# Patient Record
Sex: Male | Born: 2000 | Race: White | Hispanic: No | Marital: Single | Smoking: Never smoker
Health system: Southern US, Community
[De-identification: ages and names within clinical notes are randomized; demographics above are authoritative.]

---

## 2020-08-30 ENCOUNTER — Encounter (HOSPITAL_COMMUNITY): Payer: Self-pay | Admitting: *Deleted

## 2020-08-30 ENCOUNTER — Emergency Department (HOSPITAL_COMMUNITY)
Admission: EM | Admit: 2020-08-30 | Discharge: 2020-08-30 | Disposition: A | Discharge status: Home / Self Care | Payer: No Typology Code available for payment source | Attending: Emergency Medicine | Admitting: Emergency Medicine

## 2020-08-30 ENCOUNTER — Emergency Department (HOSPITAL_COMMUNITY): Payer: No Typology Code available for payment source

## 2020-08-30 ENCOUNTER — Other Ambulatory Visit: Payer: Self-pay

## 2020-08-30 DIAGNOSIS — Y990 Civilian activity done for income or pay: Secondary | ICD-10-CM | POA: Insufficient documentation

## 2020-08-30 DIAGNOSIS — W268XXA Contact with other sharp object(s), not elsewhere classified, initial encounter: Secondary | ICD-10-CM | POA: Insufficient documentation

## 2020-08-30 DIAGNOSIS — S51012A Laceration without foreign body of left elbow, initial encounter: Secondary | ICD-10-CM | POA: Insufficient documentation

## 2020-08-30 DIAGNOSIS — Z23 Encounter for immunization: Secondary | ICD-10-CM | POA: Diagnosis not present

## 2020-08-30 DIAGNOSIS — S59902A Unspecified injury of left elbow, initial encounter: Secondary | ICD-10-CM | POA: Diagnosis present

## 2020-08-30 MED ORDER — LIDOCAINE-EPINEPHRINE (PF) 2 %-1:200000 IJ SOLN
20.0000 mL | Freq: Once | INTRAMUSCULAR | Status: AC
  Administered 2020-08-30: 20 mL
  Filled 2020-08-30: qty 20

## 2020-08-30 MED ORDER — TETANUS-DIPHTH-ACELL PERTUSSIS 5-2.5-18.5 LF-MCG/0.5 IM SUSY
0.5000 mL | PREFILLED_SYRINGE | Freq: Once | INTRAMUSCULAR | Status: AC
  Administered 2020-08-30: 0.5 mL via INTRAMUSCULAR
  Filled 2020-08-30: qty 0.5

## 2020-08-30 NOTE — ED Provider Notes (Signed)
[facility] EMERGENCY DEPARTMENT Provider Note   CSN: [id number] Arrival date & time: 08/30/20  1511     History Chief Complaint  Patient presents with  . Laceration    Dustin Rowe is a 19 y.o. male.  The history is provided by the patient. No language interpreter was used.  Laceration Associated symptoms: no fever      19 year old male presenting for evaluation of left elbow injury.  Patient report a few hours ago he was at work, and while he was using a sledgehammer to strike an object, his left elbow struck a sharp piece of metal.  He report acute onset of sharp pain to the affected area with bleeding.  Pain is minimal at this time.  No associated numbness.  Pain is nonradiating.  He is not up-to-date with tetanus.  Is right-hand dominant.  No specific treatment tried aside from dressing.   No past medical history on file.  There are no problems to display for this patient.    The histories are not reviewed yet. Please review them in the "History" navigator section and refresh this SmartLink.     No family history on file.  Social History   Tobacco Use  . Smoking status: Never Smoker  . Smokeless tobacco: Never Used  Vaping Use  . Vaping Use: Every day  Substance Use Topics  . Alcohol use: Yes    Comment: occ use  . Drug use: Not on file    Home Medications Prior to Admission medications   Not on File    Allergies    Patient has no known allergies.  Review of Systems   Review of Systems  Constitutional: Negative for fever.  Skin: Positive for wound.  Neurological: Negative for numbness.    Physical Exam Updated Vital Signs BP 125/85 (BP Location: Right Arm)   Pulse 89   Temp 99 F (37.2 C) (Temporal)   Resp 14   Ht 5\' 10"  (1.778 m)   Wt 70.3 kg   SpO2 100%   BMI 22.24 kg/m   Physical Exam Vitals and nursing note reviewed.  Constitutional:      General: He is not in acute distress.    Appearance: He is well-developed.  HENT:      Head: Atraumatic.  Eyes:     Conjunctiva/sclera: Conjunctivae normal.  Musculoskeletal:        General: Signs of injury (Left elbow: 1 cm laceration noted superior to the elbow without joint involvement or foreign body noted.  It is not actively bleeding.  Elbow with normal flexion extension.  Sensation intact distally.) present.     Cervical back: Neck supple.  Skin:    Findings: No rash.  Neurological:     Mental Status: He is alert. Mental status is at baseline.     ED Results / Procedures / Treatments   Labs (all labs ordered are listed, but only abnormal results are displayed) Labs Reviewed - No data to display  EKG None  Radiology DG Elbow Complete Left  Result Date: 08/30/2020 CLINICAL DATA:  Laceration to left elbow. EXAM: LEFT ELBOW - COMPLETE 3+ VIEW COMPARISON:  None. FINDINGS: Soft tissue injury is identified about the medial aspect of the distal humerus . No radiopaque foreign object. No acute fracture or dislocation. No joint effusion. IMPRESSION: No acute osseous abnormality. Electronically Signed   By: 14/11/2019 M.D.   On: 08/30/2020 16:21    Procedures .14/03/2021Laceration Repair  Date/Time: 08/30/2020 5:44 PM Performed by: 14/11/2019,  Greta Doom, PA-C Authorized by: Fayrene Helper, PA-C   Consent:    Consent obtained:  Verbal   Consent given by:  Patient   Risks discussed:  Infection, need for additional repair, pain, poor cosmetic result and poor wound healing   Alternatives discussed:  No treatment and delayed treatment Universal protocol:    Procedure explained and questions answered to patient or proxy's satisfaction: yes     Relevant documents present and verified: yes     Test results available and properly labeled: yes     Imaging studies available: yes     Required blood products, implants, devices, and special equipment available: yes     Site/side marked: yes     Immediately prior to procedure, a time out was called: yes     Patient identity confirmed:   Verbally with patient Anesthesia (see MAR for exact dosages):    Anesthesia method:  Local infiltration   Local anesthetic:  Lidocaine 1% WITH epi Laceration details:    Location:  Shoulder/arm   Shoulder/arm location:  L elbow   Length (cm):  1   Depth (mm):  4 Repair type:    Repair type:  Intermediate Pre-procedure details:    Preparation:  Patient was prepped and draped in usual sterile fashion and imaging obtained to evaluate for foreign bodies Exploration:    Hemostasis achieved with:  Epinephrine   Wound exploration: wound explored through full range of motion and entire depth of wound probed and visualized     Wound extent: muscle damage and vascular damage     Wound extent: no fascia violation noted, no foreign bodies/material noted, no nerve damage noted and no tendon damage noted     Contaminated: no   Treatment:    Area cleansed with:  Betadine and saline   Amount of cleaning:  Standard   Irrigation solution:  Sterile saline   Visualized foreign bodies/material removed: no   Skin repair:    Repair method:  Sutures   Suture size:  5-0   Suture material:  Prolene   Suture technique:  Simple interrupted   Number of sutures:  3 Approximation:    Approximation:  Close Post-procedure details:    Dressing:  Non-adherent dressing   Patient tolerance of procedure:  Tolerated well, no immediate complications   (including critical care time)  Medications Ordered in ED Medications  lidocaine-EPINEPHrine (XYLOCAINE W/EPI) 2 %-1:100000 (with pres) injection 20 mL (has no administration in time range)  Tdap (BOOSTRIX) injection 0.5 mL (has no administration in time range)    ED Course  I have reviewed the triage vital signs and the nursing notes.  Pertinent labs & imaging results that were available during my care of the patient were reviewed by me and considered in my medical decision making (see chart for details).    MDM Rules/Calculators/A&P                           BP 125/85 (BP Location: Right Arm)   Pulse 89   Temp 99 F (37.2 C) (Temporal)   Resp 14   Ht 5\' 10"  (1.778 m)   Wt 70.3 kg   SpO2 100%   BMI 22.24 kg/m   Final Clinical Impression(s) / ED Diagnoses Final diagnoses:  Elbow laceration, left, initial encounter    Rx / DC Orders ED Discharge Orders    None     5:09 PM Patient accidentally injured his left elbow while striking  an object with a sledgehammer and accidentally cut himself with a sharp nearby metal object.  He has a laceration above his left elbow without joint involvement.  X-ray of the left elbow without acute abnormalities.  Will update tetanus and will perform laceration repair.  5:45 PM Successful lac repair.  Recommend sutures removal in 7 days.  Wound care instruction provided.  Work note provided.     Fayrene Helper, PA-C 08/30/20 1747    Benjiman Core, MD 08/31/20 [phone]

## 2020-08-30 NOTE — ED Notes (Signed)
ED provider at bedside for laceration repair

## 2020-08-30 NOTE — ED Triage Notes (Signed)
Pt with laceration to left elbow on metal tank at work.  Last shot tetanus unknown.

## 2020-08-30 NOTE — Discharge Instructions (Signed)
Please clean his wound daily and apply Neosporin over the wound daily to decrease risk of infection.  Take over-the-counter Tylenol or ibuprofen as needed for pain.  Have your sutures removed in 7 days.

## 2020-09-06 ENCOUNTER — Emergency Department (HOSPITAL_COMMUNITY)
Admission: EM | Admit: 2020-09-06 | Discharge: 2020-09-06 | Disposition: A | Discharge status: Home / Self Care | Payer: No Typology Code available for payment source | Attending: Emergency Medicine | Admitting: Emergency Medicine

## 2020-09-06 DIAGNOSIS — Z4802 Encounter for removal of sutures: Secondary | ICD-10-CM

## 2020-09-06 DIAGNOSIS — S51012D Laceration without foreign body of left elbow, subsequent encounter: Secondary | ICD-10-CM | POA: Insufficient documentation

## 2020-09-06 DIAGNOSIS — W19XXXD Unspecified fall, subsequent encounter: Secondary | ICD-10-CM | POA: Insufficient documentation

## 2020-09-06 DIAGNOSIS — Y990 Civilian activity done for income or pay: Secondary | ICD-10-CM | POA: Insufficient documentation

## 2020-09-06 NOTE — ED Triage Notes (Signed)
Suture removal

## 2020-09-06 NOTE — Discharge Instructions (Addendum)
Please get established with a primary care provider so that you may receive ongoing evaluation and management of your health and wellbeing.  I personally reviewed plain films obtained at last ED encounter and there is no evidence of fracture.  Your exam here today is reassuring.  No evidence of infection.  I encourage you to continue to keep the area clean and covered.  You may call Dr. Susa Simmonds, orthopedics, to schedule an appointment if your elbow fails to improve despite conservative management.  Return to the ED or seek immediate medical attention should you experience any new or worsening symptoms.

## 2020-09-06 NOTE — ED Provider Notes (Signed)
[facility] EMERGENCY DEPARTMENT Provider Note   CSN: [id number] Arrival date & time: 09/06/20  1251     History Chief Complaint  Patient presents with  . Suture / Staple Removal    Dustin Rowe is a 19 y.o. male who presents the ED for a suture removal.  Patient had been evaluated on 08/30/2020 for a small, 1 cm laceration to his left elbow.  It was repaired using 3 simple interrupted Prolene sutures.  Tetanus was updated.  On my examination, patient still endorses mild tenderness involving his left elbow from the impact of the fall, but denies any pain specifically at site of laceration.  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  He is right-hand dominant.  Denies any fevers or chills.  No numbness or weakness.  He has been keeping the area clean and covered.  Denies any evidence of infection.  HPI     No past medical history on file.  There are no problems to display for this patient.     No family history on file.  Social History   Tobacco Use  . Smoking status: Never Smoker  . Smokeless tobacco: Never Used  Vaping Use  . Vaping Use: Every day  Substance Use Topics  . Alcohol use: Yes    Comment: occ use    Home Medications Prior to Admission medications   Not on File    Allergies    Patient has no known allergies.  Review of Systems   Review of Systems  Constitutional: Negative for fever.  Skin: Positive for wound.  Hematological: Does not bruise/bleed easily.    Physical Exam Updated Vital Signs BP (!) 120/92 (BP Location: Right Arm)   Pulse 79   Temp 98.6 F (37 C) (Oral)   Resp 18   Ht 5\' 10"  (1.778 m)   Wt 71.2 kg   SpO2 100%   BMI 22.53 kg/m   Physical Exam Vitals and nursing note reviewed. Exam conducted with a chaperone present.  Constitutional:      Appearance: Normal appearance.  HENT:     Head: Normocephalic and atraumatic.  Eyes:     General: No scleral icterus.     Conjunctiva/sclera: Conjunctivae normal.  Pulmonary:     Effort: Pulmonary effort is normal.  Skin:    General: Skin is dry.     Capillary Refill: Capillary refill takes less than 2 seconds.     Comments: 2 cm laceration, well-healing.  No significant surrounding erythema or induration.  No purulent drainage.  Scab intact.  3 sutures intact.  Neurological:     General: No focal deficit present.     Mental Status: He is alert and oriented to person, place, and time.     GCS: GCS eye subscore is 4. GCS verbal subscore is 5. GCS motor subscore is 6.     Cranial Nerves: No cranial nerve deficit.     Sensory: No sensory deficit.     Coordination: Coordination normal.  Psychiatric:        Mood and Affect: Mood normal.        Behavior: Behavior normal.        Thought Content: Thought content normal.     ED Results / Procedures / Treatments   Labs (all labs ordered are listed, but only abnormal results are displayed) Labs Reviewed - No data to display  EKG None  Radiology No results found.  Procedures .Suture Removal  Date/Time:  09/06/2020 1:39 PM Performed by: Lorelee New, PA-C Authorized by: Lorelee New, PA-C   Consent:    Consent obtained:  Verbal   Risks discussed:  Bleeding, pain and wound separation Universal protocol:    Patient identity confirmed:  Verbally with patient Location:    Location:  Upper extremity   Upper extremity location:  Elbow   Elbow location:  L elbow Procedure details:    Wound appearance:  No signs of infection   Number of sutures removed:  3 Post-procedure details:    Post-removal:  No dressing applied   Procedure completion:  Tolerated well, no immediate complications   (including critical care time)  Medications Ordered in ED Medications - No data to display  ED Course  I have reviewed the triage vital signs and the nursing notes.  Pertinent labs & imaging results that were available during my care of the patient were  reviewed by me and considered in my medical decision making (see chart for details).    MDM Rules/Calculators/A&P                          I personally reviewed plain films from prior encounter which are without any evidence for fracture.  His elbow ROM is fully intact with strength against resistance.  He is neurovascularly intact.  Peripheral pulse and sensation intact throughout.  There is no evidence of infection involving his wound.  Patient tolerated the suture removal procedure without difficulty.  Encouraged him to follow-up with his primary care provider for ongoing evaluation and management.  If his elbow discomfort fails to improve despite conservative management, he may follow-up with Dr. Susa Simmonds with orthopedics for ongoing evaluation.    Encouraged to keep the area clean and covered and to not pick at the wound/scabs.  ED return precautions discussed.  Patient voiced understanding and is agreeable to the plan.  Final Clinical Impression(s) / ED Diagnoses Final diagnoses:  Visit for suture removal    Rx / DC Orders ED Discharge Orders    None       Lorelee New, PA-C 09/06/20 1401    Vanetta Mulders, MD 10/05/20 Windell Moment

## 2021-07-02 ENCOUNTER — Emergency Department (HOSPITAL_COMMUNITY)
Admission: EM | Admit: 2021-07-02 | Discharge: 2021-07-02 | Disposition: A | Discharge status: Home / Self Care | Payer: BC Managed Care – PPO | Attending: Emergency Medicine | Admitting: Emergency Medicine

## 2021-07-02 ENCOUNTER — Emergency Department (HOSPITAL_COMMUNITY): Payer: BC Managed Care – PPO

## 2021-07-02 ENCOUNTER — Other Ambulatory Visit: Payer: Self-pay

## 2021-07-02 ENCOUNTER — Encounter (HOSPITAL_COMMUNITY): Payer: Self-pay

## 2021-07-02 DIAGNOSIS — R112 Nausea with vomiting, unspecified: Secondary | ICD-10-CM

## 2021-07-02 DIAGNOSIS — R1110 Vomiting, unspecified: Secondary | ICD-10-CM | POA: Diagnosis not present

## 2021-07-02 DIAGNOSIS — R109 Unspecified abdominal pain: Secondary | ICD-10-CM | POA: Diagnosis present

## 2021-07-02 DIAGNOSIS — R1013 Epigastric pain: Secondary | ICD-10-CM | POA: Insufficient documentation

## 2021-07-02 LAB — COMPREHENSIVE METABOLIC PANEL
ALT: 20 U/L (ref 0–44)
AST: 21 U/L (ref 15–41)
Albumin: 5 g/dL (ref 3.5–5.0)
Alkaline Phosphatase: 62 U/L (ref 38–126)
Anion gap: 9 (ref 5–15)
BUN: 13 mg/dL (ref 6–20)
CO2: 25 mmol/L (ref 22–32)
Calcium: 9.7 mg/dL (ref 8.9–10.3)
Chloride: 104 mmol/L (ref 98–111)
Creatinine, Ser: 0.72 mg/dL (ref 0.61–1.24)
GFR, Estimated: >60 mL/min (ref >60)
Glucose, Bld: 81 mg/dL (ref 70–99)
Potassium: 4 mmol/L (ref 3.5–5.1)
Sodium: 138 mmol/L (ref 135–145)
Total Bilirubin: 0.9 mg/dL (ref 0.3–1.2)
Total Protein: 8.3 g/dL — ABNORMAL HIGH (ref 6.5–8.1)

## 2021-07-02 LAB — CBC
HCT: 47.8 % (ref 39.0–52.0)
Hemoglobin: 16.4 g/dL (ref 13.0–17.0)
MCH: 33.2 pg (ref 26.0–34.0)
MCHC: 34.3 g/dL (ref 30.0–36.0)
MCV: 96.8 fL (ref 80.0–100.0)
Platelets: 388 10*3/uL (ref 150–400)
RBC: 4.94 MIL/uL (ref 4.22–5.81)
RDW: 13.2 % (ref 11.5–15.5)
WBC: 6.7 10*3/uL (ref 4.0–10.5)
nRBC: 0 % (ref 0.0–0.2)

## 2021-07-02 LAB — DIFFERENTIAL
Abs Immature Granulocytes: 0.02 10*3/uL (ref 0.00–0.07)
Basophils Absolute: 0.1 10*3/uL (ref 0.0–0.1)
Basophils Relative: 1 %
Eosinophils Absolute: 0.1 10*3/uL (ref 0.0–0.5)
Eosinophils Relative: 1 %
Immature Granulocytes: 0 %
Lymphocytes Relative: 25 %
Lymphs Abs: 1.7 10*3/uL (ref 0.7–4.0)
Monocytes Absolute: 0.7 10*3/uL (ref 0.1–1.0)
Monocytes Relative: 10 %
Neutro Abs: 4.3 10*3/uL (ref 1.7–7.7)
Neutrophils Relative %: 63 %

## 2021-07-02 LAB — LIPASE, BLOOD: Lipase: 28 U/L (ref 11–51)

## 2021-07-02 LAB — TYPE AND SCREEN
ABO/RH(D): A POS
Antibody Screen: NEGATIVE

## 2021-07-02 MED ORDER — PANTOPRAZOLE SODIUM 40 MG IV SOLR
40.0000 mg | Freq: Once | INTRAVENOUS | Status: AC
  Administered 2021-07-02: 40 mg via INTRAVENOUS
  Filled 2021-07-02: qty 40

## 2021-07-02 MED ORDER — ONDANSETRON HCL 4 MG/2ML IJ SOLN
4.0000 mg | Freq: Once | INTRAMUSCULAR | Status: AC
  Administered 2021-07-02: 4 mg via INTRAVENOUS
  Filled 2021-07-02: qty 2

## 2021-07-02 MED ORDER — SODIUM CHLORIDE 0.9 % IV BOLUS
500.0000 mL | Freq: Once | INTRAVENOUS | Status: AC
  Administered 2021-07-02: 500 mL via INTRAVENOUS

## 2021-07-02 MED ORDER — PANTOPRAZOLE SODIUM 20 MG PO TBEC: 20.0000 mg | DELAYED_RELEASE_TABLET | Freq: Every day | ORAL | 2 refills | Status: AC

## 2021-07-02 MED ORDER — FAMOTIDINE 40 MG PO TABS: 40.0000 mg | ORAL_TABLET | Freq: Every day | ORAL | 2 refills | Status: AC

## 2021-07-02 NOTE — ED Provider Notes (Signed)
[facility] EMERGENCY DEPARTMENT Provider Note   CSN: [id number] Arrival date & time: 07/02/21  2952     History Chief Complaint  Patient presents with   Abdominal Pain    Dustin Rowe is a 20 y.o. male.  HPI  Patient presents with coffee-ground emesis.  Reports he woke up around 3 AM this morning and had an episode of emesis where the vomit was brown with coffee-ground material.  He vomited 2 times after that, reports that after the emesis episodes he did have some cramping upper abdominal pain.  Patient takes [facility] powder every other day for arm pain.  Denies any history of ulcers, drinks about a beer every other day with dinner.  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  Does not have any shortness of breath or chest pain.  No prior abdominal surgeries.  Patient does not take any reflux medicine, has never been followed by GI.  No previous history of the same.  Has not tried any medications to alleviate the symptoms, denies knowing any aggravating factors.  Not on blood thinners.  No black and tarry stools or rectal bleeding.  History reviewed. No pertinent past medical history.  There are no problems to display for this patient.   History reviewed. No pertinent surgical history.     History reviewed. No pertinent family history.  Social History   Tobacco Use   Smoking status: Never   Smokeless tobacco: Never  Vaping Use   Vaping Use: Every day  Substance Use Topics   Alcohol use: Yes    Comment: occ use    Home Medications Prior to Admission medications   Not on File    Allergies    Patient has no known allergies.  Review of Systems   Review of Systems  Constitutional:  Negative for appetite change, chills, fatigue and fever.  HENT:  Negative for ear pain and sore throat.   Eyes:  Negative for pain and visual disturbance.  Respiratory:   Negative for cough and shortness of breath.   Cardiovascular:  Negative for chest pain and palpitations.  Gastrointestinal:  Positive for abdominal pain, nausea and vomiting. Negative for anal bleeding, blood in stool and diarrhea.  Genitourinary:  Negative for dysuria and hematuria.  Musculoskeletal:  Negative for arthralgias and back pain.  Skin:  Negative for color change and rash.  Neurological:  Negative for dizziness, seizures and syncope.  All other systems reviewed and are negative.  Physical Exam Updated Vital Signs BP 129/78 (BP Location: Right Arm)   Pulse 91   Ht 5\' 11"  (1.803 m)   Wt 70.3 kg   SpO2 100%   BMI 21.62 kg/m   Physical Exam Vitals and nursing note reviewed. Exam conducted with a chaperone present.  Constitutional:      Appearance: Normal appearance.     Comments: Resting comfortably, no acute distress  HENT:     Head: Normocephalic and atraumatic.  Eyes:     General: No scleral icterus.       Right eye: No discharge.        Left eye: No discharge.     Extraocular Movements: Extraocular movements intact.     Pupils: Pupils are equal, round, and reactive to light.  Cardiovascular:     Rate and Rhythm: Normal rate and regular rhythm.     Pulses: Normal pulses.  Heart sounds: Normal heart sounds. No murmur heard.   No friction rub. No gallop.  Pulmonary:     Effort: Pulmonary effort is normal. No respiratory distress.     Breath sounds: Normal breath sounds.     Comments: Lungs clear to auscultation bilaterally Abdominal:     General: Abdomen is flat. Bowel sounds are normal. There is no distension.     Palpations: Abdomen is soft.     Tenderness: There is abdominal tenderness in the epigastric area.     Comments: Abdomen is soft, epigastric tenderness without any rigidity or guarding.  Skin:    General: Skin is warm and dry.     Coloration: Skin is not jaundiced.  Neurological:     Mental Status: He is alert. Mental status is at baseline.      Coordination: Coordination normal.    ED Results / Procedures / Treatments   Labs (all labs ordered are listed, but only abnormal results are displayed) Labs Reviewed  COMPREHENSIVE METABOLIC PANEL  CBC  TYPE AND SCREEN    EKG None  Radiology No results found.  Procedures Procedures   Medications Ordered in ED Medications - No data to display  ED Course  I have reviewed the triage vital signs and the nursing notes.  Pertinent labs & imaging results that were available during my care of the patient were reviewed by me and considered in my medical decision making (see chart for details).    MDM Rules/Calculators/A&P                           Patient is hemodynamically stable, no hypotension or tachycardia that be concerning for acute bleed.  Epigastric tenderness without any guarding or rigidity, will start with labs and consider additional imaging at a later stage in the evaluation.  Patient did not have any preceding episodes of coughing or vomiting, doubt this is a Mallory-Weiss tear given that it also presented with the first episode of emesis.  Patient does use anti-inflammatory medicine and drink alcohol, could be possible gastric ulcer or gastritis.  Pancreatitis seems less likely given the coffee-ground emesis, but will check lipase.  We will give patient pantoprazole.  No chest pain or shortness of breath, the patient does work in a position where they could be aspirated foreign body.  Will evaluate with radiograph.  This does seem somewhat less likely given there is no hematemesis or coughing.              No anemia, no leukocytosis.  Lipase is not elevated, doubt this is pancreatitis.  No gross electrolyte derangement.  Orthostatic vital signs are stable.  I suspect patient's symptoms are likely due to gastritis or gastric ulcer.  Will start on PPI and famotidine, will have him follow-up with GI.  Patient given strict return precautions, serial abdominal exams have  been negative.  Doubt acute abdomen, patient's vitals are stable and he is appropriate for discharge at this time.  Final Clinical Impression(s) / ED Diagnoses Final diagnoses:  None    Rx / DC Orders ED Discharge Orders     None        Theron Arista, PA-C 07/02/21 1717    Cathren Laine, MD 07/03/21 1353

## 2021-07-02 NOTE — ED Triage Notes (Signed)
Pt reports abd pain, nausea, and vomiting coffee ground emesis since 3am.  Reports has vomited x 3 today.  Denies diarrhea.  LBM was 7:30 am and was normal per pt.

## 2021-07-02 NOTE — Discharge Instructions (Addendum)
Take Protonix once in the morning 30 minutes before your first meal the day.  Take Pepcid 40 mg once daily in the morning when you take the Protonix or at night. Your work-up today was reassuring, no signs of infection or concerning blood loss.  I suspect the episode today could be due to gastritis, which is an irritation of the stomach lining or due to possible Gastric ulcer in the stomach.  Both of these conditions are worsened by alcochol and antiinflammatory medicine and will resolve with time. Please discontinue taking any anti-inflammatory drugs, take Tylenol instead for pain.  Abstain from any alcoholic beverage consumption as well.  Do this until you are able to follow-up with GI for re-evaluation.   Schedule follow up evaluation with GI group. Information is attached.

## 2022-07-23 IMAGING — DX DG ELBOW COMPLETE 3+V*L*
4 series · 4 of 4 positions shown · non-contrast
Comparison: None.

CLINICAL DATA: Laceration to left elbow.

EXAM:
LEFT ELBOW - COMPLETE 3+ VIEW

[elbow ap]
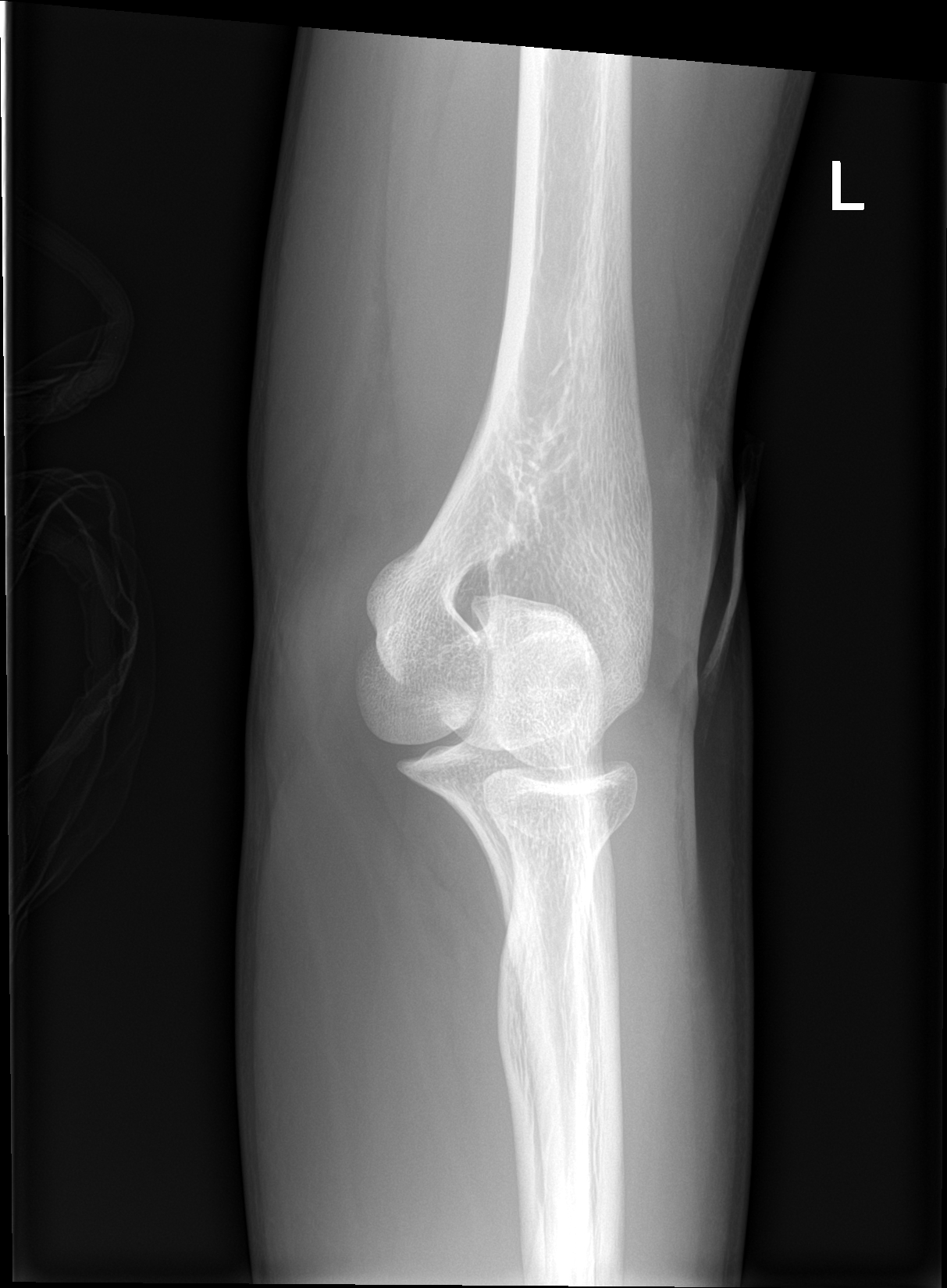

[elbow lat]
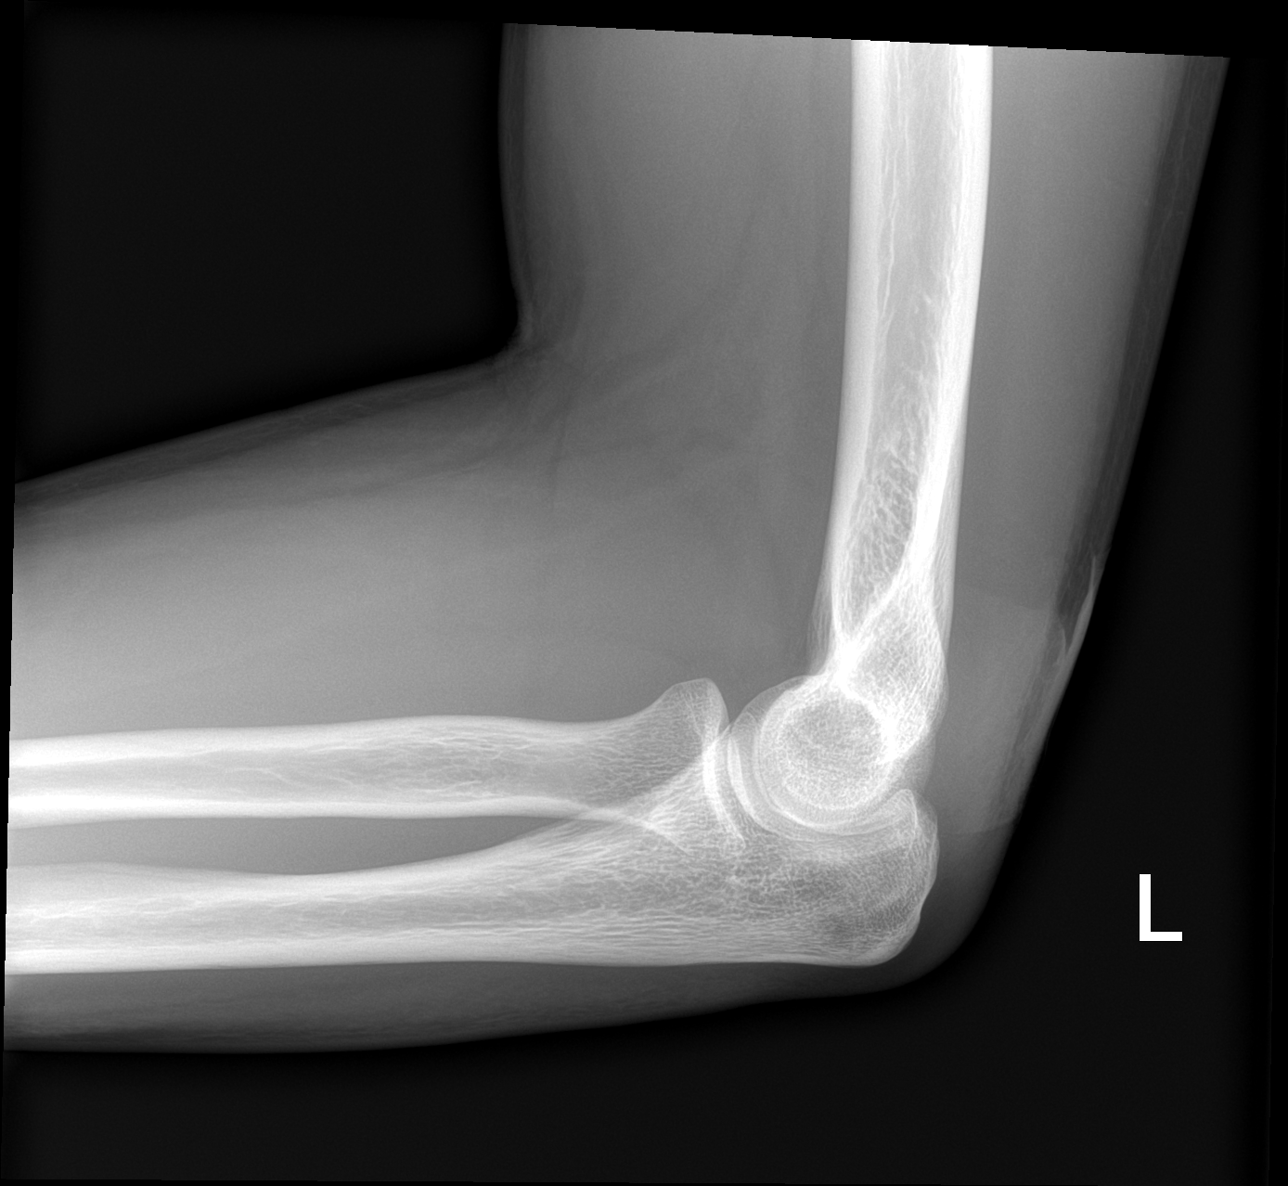

[elbow obl (1 of 2)]
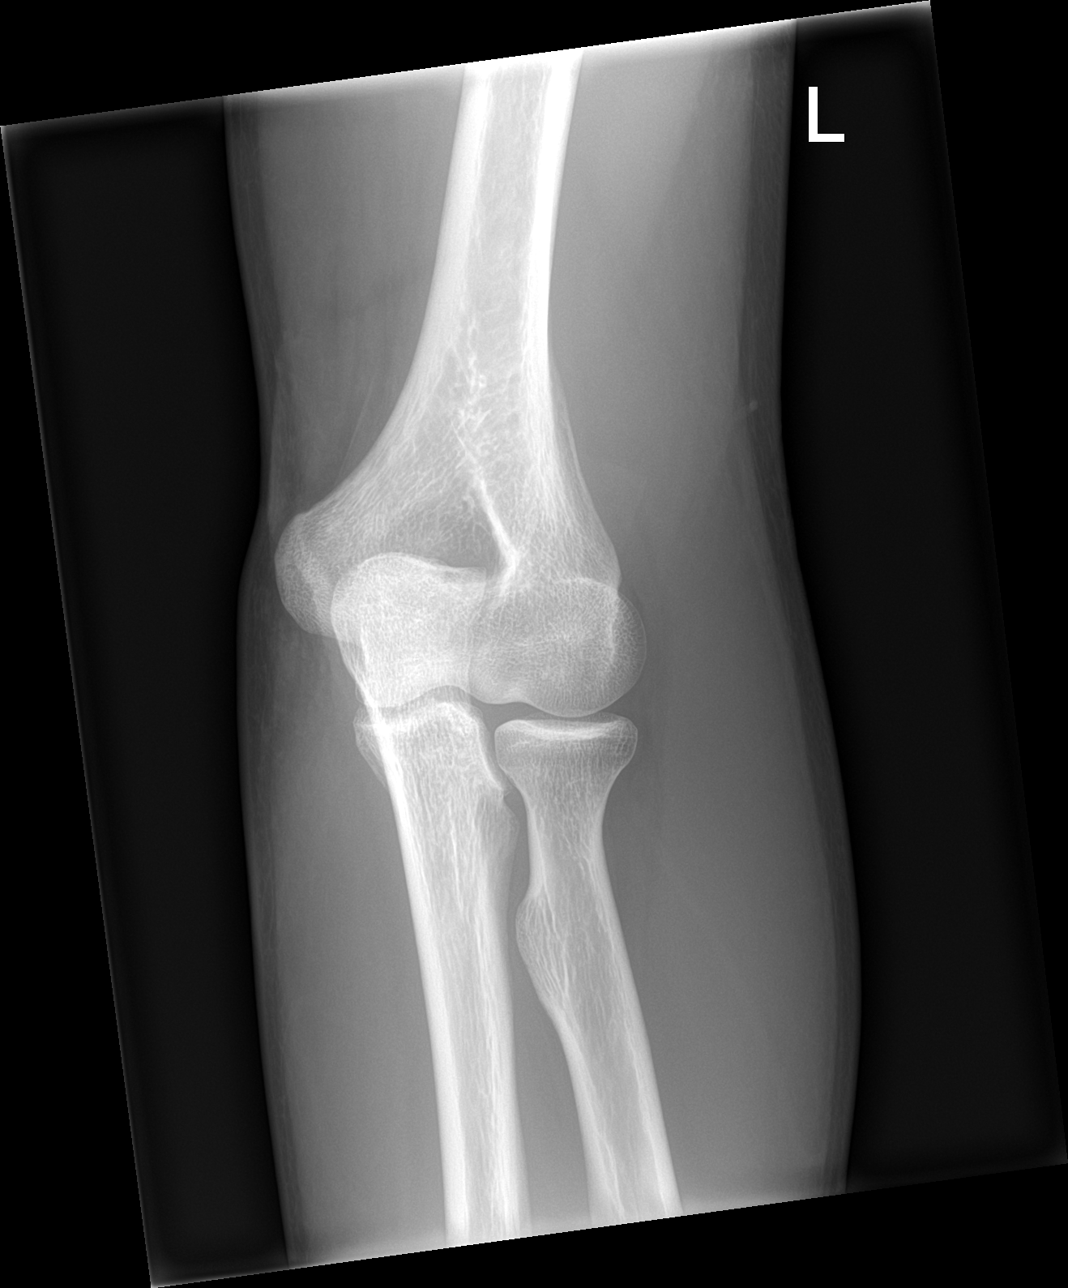

[elbow obl (2 of 2)]
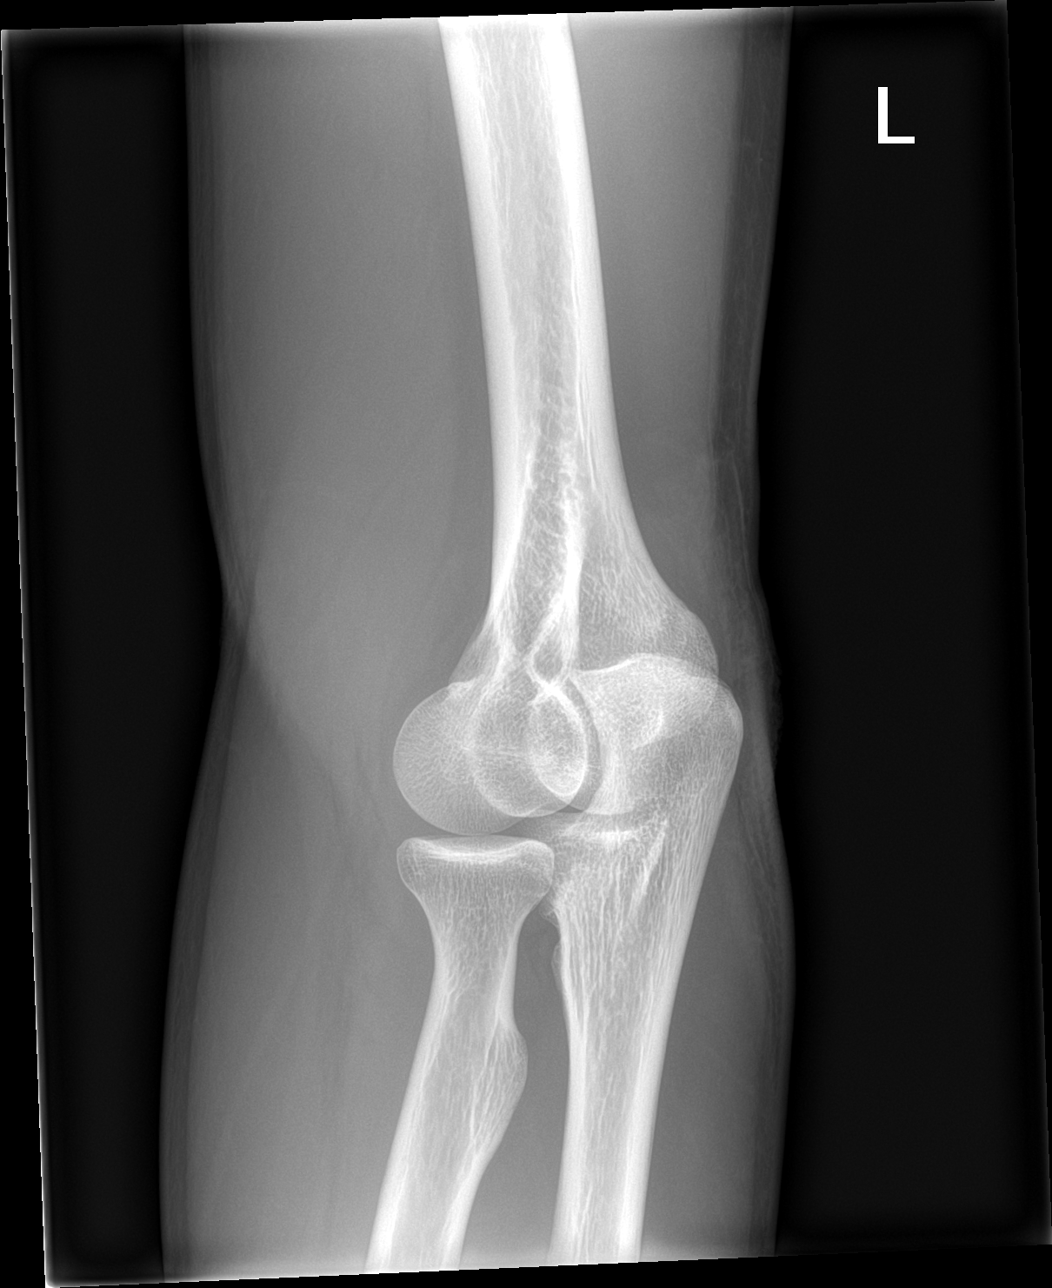

[4 of 4 positions shown; findings below may reference images not displayed]

FINDINGS: Soft tissue injury is identified about the medial aspect of the
distal humerus . No radiopaque foreign object. No acute fracture or
dislocation. No joint effusion.
IMPRESSION: No acute osseous abnormality.

## 2023-05-25 IMAGING — DX DG CHEST 2V
2 series · 2 of 2 positions shown · non-contrast
Comparison: None.

CLINICAL DATA: Emesis

EXAM:
CHEST - 2 VIEW

[chest pa]
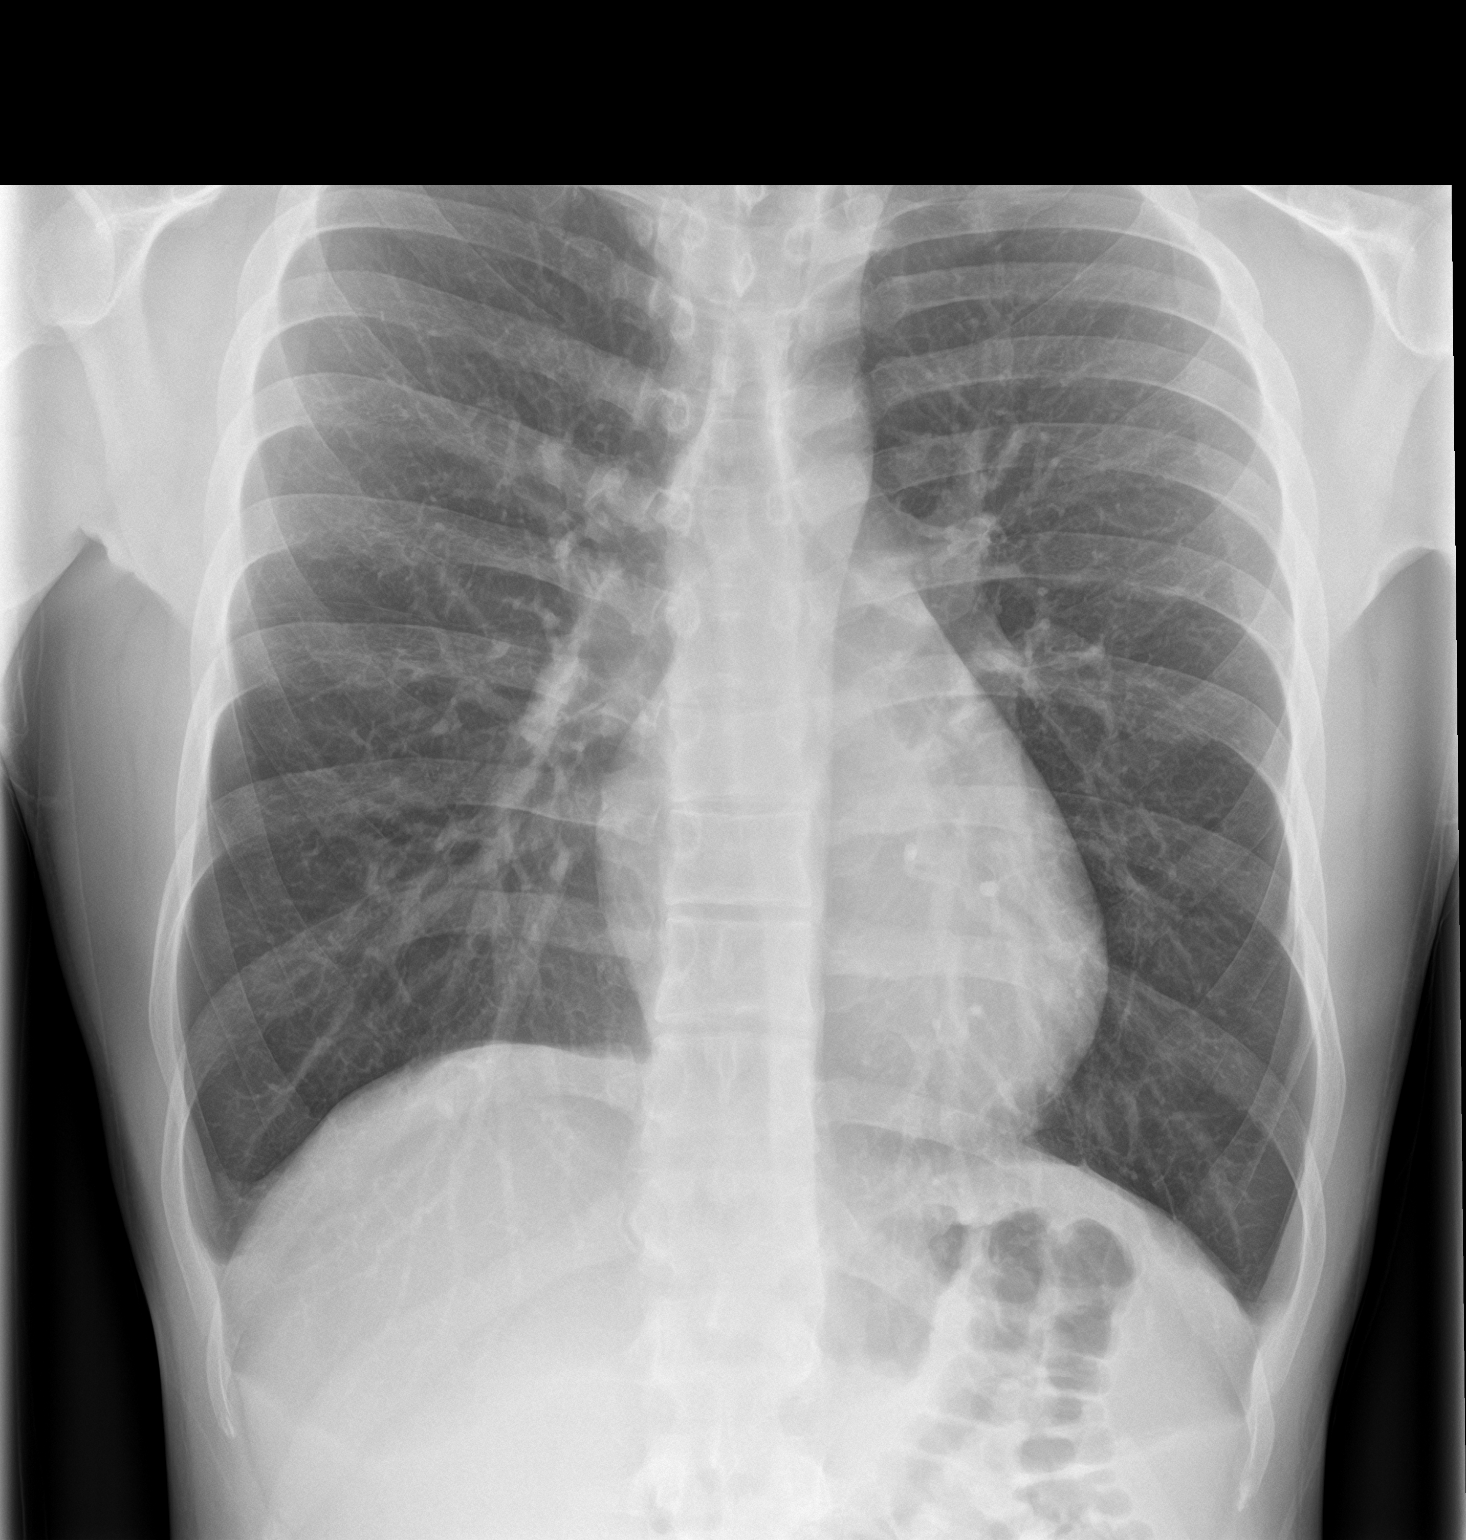

[chest lat]
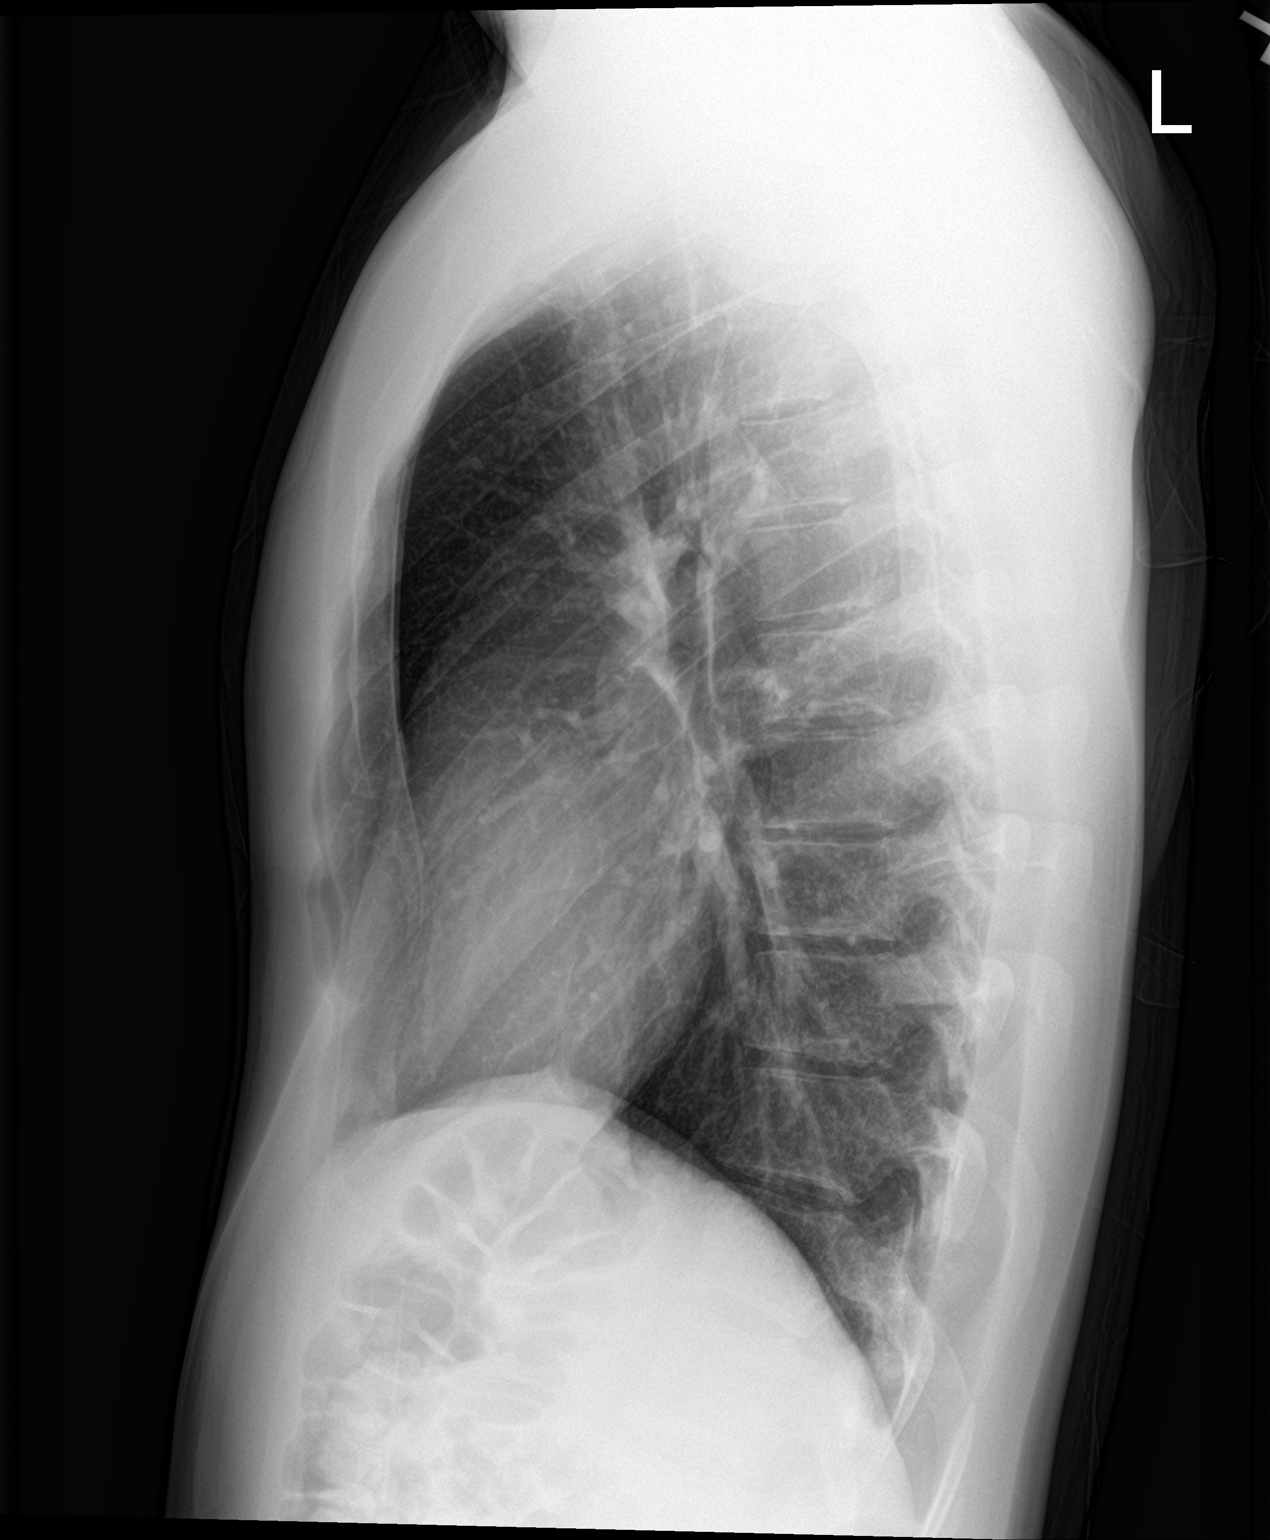

[2 of 2 positions shown; findings below may reference images not displayed]

FINDINGS: The cardiomediastinal silhouette is within normal limits. No pleural
effusion. No pneumothorax. No mass or consolidation. No acute
osseous abnormality.
IMPRESSION: Normal chest radiograph.
